# Patient Record
Sex: Male | Born: 1955 | Race: White | Hispanic: No | Marital: Married | Smoking: Current every day smoker
Health system: Southern US, Community
[De-identification: ages and names within clinical notes are randomized; demographics above are authoritative.]

## PROBLEM LIST (undated history)

## (undated) DIAGNOSIS — S0590XA Unspecified injury of unspecified eye and orbit, initial encounter: Secondary | ICD-10-CM

## (undated) DIAGNOSIS — B029 Zoster without complications: Secondary | ICD-10-CM

## (undated) HISTORY — PX: TONSILLECTOMY: SUR1361

---

## 2011-11-23 ENCOUNTER — Encounter (HOSPITAL_BASED_OUTPATIENT_CLINIC_OR_DEPARTMENT_OTHER): Payer: Self-pay | Admitting: Respiratory Therapy

## 2011-11-23 ENCOUNTER — Emergency Department (HOSPITAL_BASED_OUTPATIENT_CLINIC_OR_DEPARTMENT_OTHER)
Admission: EM | Admit: 2011-11-23 | Discharge: 2011-11-23 | Disposition: A | Discharge status: Home / Self Care | Payer: Worker's Compensation | Attending: Emergency Medicine | Admitting: Emergency Medicine

## 2011-11-23 DIAGNOSIS — F17200 Nicotine dependence, unspecified, uncomplicated: Secondary | ICD-10-CM | POA: Insufficient documentation

## 2011-11-23 DIAGNOSIS — J45909 Unspecified asthma, uncomplicated: Secondary | ICD-10-CM | POA: Insufficient documentation

## 2011-11-23 DIAGNOSIS — L089 Local infection of the skin and subcutaneous tissue, unspecified: Secondary | ICD-10-CM

## 2011-11-23 DIAGNOSIS — IMO0002 Reserved for concepts with insufficient information to code with codable children: Secondary | ICD-10-CM | POA: Insufficient documentation

## 2011-11-23 DIAGNOSIS — Y92009 Unspecified place in unspecified non-institutional (private) residence as the place of occurrence of the external cause: Secondary | ICD-10-CM | POA: Insufficient documentation

## 2011-11-23 HISTORY — DX: Unspecified injury of unspecified eye and orbit, initial encounter: S05.90XA

## 2011-11-23 MED ORDER — SULFAMETHOXAZOLE-TRIMETHOPRIM 800-160 MG PO TABS: 1.0000 | ORAL_TABLET | Freq: Two times a day (BID) | ORAL | Status: AC

## 2011-11-23 NOTE — ED Notes (Signed)
Pt touched his left upper arm with a hand sander on Thursday. Abrasion/burn to area. Also needs drug test.

## 2011-11-23 NOTE — Discharge Instructions (Signed)
Abrasions Abrasions are skin scrapes. Their treatment depends on how large and deep the abrasion is. Abrasions do not extend through all layers of the skin. A cut or lesion through all skin layers is called a laceration. HOME CARE INSTRUCTIONS   If you were given a dressing, change it at least once a day or as instructed by your caregiver. If the bandage sticks, soak it off with a solution of water or hydrogen peroxide.   Twice a day, wash the area with soap and water to remove all the cream/ointment. You may do this in a sink, under a tub faucet, or in a shower. Rinse off the soap and pat dry with a clean towel. Look for signs of infection (see below).   Reapply cream/ointment according to your caregiver's instruction. This will help prevent infection and keep the bandage from sticking. Telfa or gauze over the wound and under the dressing or wrap will also help keep the bandage from sticking.   If the bandage becomes wet, dirty, or develops a foul smell, change it as soon as possible.   Only take over-the-counter or prescription medicines for pain, discomfort, or fever as directed by your caregiver.  SEEK IMMEDIATE MEDICAL CARE IF:   Increasing pain in the wound.   Signs of infection develop: redness, swelling, surrounding area is tender to touch, or pus coming from the wound.   You have a fever.   Any foul smell coming from the wound or dressing.  Most skin wounds heal within ten days. Facial wounds heal faster. However, an infection may occur despite proper treatment. You should have the wound checked for signs of infection within 24 to 48 hours or sooner if problems arise. If you were not given a wound-check appointment, look closely at the wound yourself on the second day for early signs of infection listed above. MAKE SURE YOU:   Understand these instructions.   Will watch your condition.   Will get help right away if you are not doing well or get worse.  Document Released:  06/05/2005 Document Revised: 08/15/2011 Document Reviewed: 07/30/2011 [facility] Patient Information 2012 [place], [place].Abrasions Abrasions are skin scrapes. Their treatment depends on how large and deep the abrasion is. Abrasions do not extend through all layers of the skin. A cut or lesion through all skin layers is called a laceration. HOME CARE INSTRUCTIONS   If you were given a dressing, change it at least once a day or as instructed by your caregiver. If the bandage sticks, soak it off with a solution of water or hydrogen peroxide.   Twice a day, wash the area with soap and water to remove all the cream/ointment. You may do this in a sink, under a tub faucet, or in a shower. Rinse off the soap and pat dry with a clean towel. Look for signs of infection (see below).   Reapply cream/ointment according to your caregiver's instruction. This will help prevent infection and keep the bandage from sticking. Telfa or gauze over the wound and under the dressing or wrap will also help keep the bandage from sticking.   If the bandage becomes wet, dirty, or develops a foul smell, change it as soon as possible.   Only take over-the-counter or prescription medicines for pain, discomfort, or fever as directed by your caregiver.  SEEK IMMEDIATE MEDICAL CARE IF:   Increasing pain in the wound.   Signs of infection develop: redness, swelling, surrounding area is tender to touch, or pus coming from  the wound.   You have a fever.   Any foul smell coming from the wound or dressing.  Most skin wounds heal within ten days. Facial wounds heal faster. However, an infection may occur despite proper treatment. You should have the wound checked for signs of infection within 24 to 48 hours or sooner if problems arise. If you were not given a wound-check appointment, look closely at the wound yourself on the second day for early signs of infection listed above. MAKE SURE YOU:   Understand these instructions.    Will watch your condition.   Will get help right away if you are not doing well or get worse.  Document Released: 06/05/2005 Document Revised: 08/15/2011 Document Reviewed: 07/30/2011 [facility] Patient Information 2012 [place], [place].

## 2011-11-23 NOTE — ED Provider Notes (Signed)
History     CSN: [id number]  Arrival date & time 11/23/11  1320   None     Chief Complaint  Patient presents with  . Burn    (Consider location/radiation/quality/duration/timing/severity/associated sxs/prior treatment) Patient is a 56 y.o. male presenting with arm injury. The history is provided by the patient. No language interpreter was used.  Arm Injury  The incident occurred more than 2 days ago. The incident occurred at home. The injury mechanism is unknown. No protective equipment was used. There is an injury to the left upper arm. The pain is moderate. It is unlikely that a foreign body is present. There have been no prior injuries to these areas. His tetanus status is UTD.  Pt reports he scraped his arm with a sander.  Pt was sent here by employer for evaluation and drug screen   Past Medical History  Diagnosis Date  . Asthma   . Eye injury     History reviewed. No pertinent past surgical history.  History reviewed. No pertinent family history.  History  Substance Use Topics  . Smoking status: Current Everyday Smoker  . Smokeless tobacco: Not on file  . Alcohol Use: No      Review of Systems  Skin: Positive for wound.  All other systems reviewed and are negative.    Allergies  Review of patient's allergies indicates not on file.  Home Medications  No current outpatient prescriptions on file.  BP 124/74  Pulse 88  Temp(Src) 98.6 F (37 C) (Oral)  Resp 20  Ht 6\' 1"  (1.854 m)  Wt 225 lb (102.059 kg)  BMI 29.69 kg/m2  SpO2 99%  Physical Exam  Nursing note and vitals reviewed. Constitutional: He is oriented to person, place, and time. He appears well-developed and well-nourished.  HENT:  Head: Normocephalic and atraumatic.  Musculoskeletal: He exhibits tenderness.  Neurological: He is alert and oriented to person, place, and time.  Skin: There is erythema.  Psychiatric: He has a normal mood and affect.    ED Course  Procedures (including  critical care time)  Labs Reviewed - No data to display No results found.   No diagnosis found.    MDM  Pt given rx for bactrim.  I advised topical antibiotics       Lonia Skinner [place], [place] 11/23/11 1510

## 2011-11-23 NOTE — ED Notes (Signed)
Patients triage done by Thomasene Ripple, RN Not EMT

## 2011-11-23 NOTE — ED Provider Notes (Signed)
Medical screening examination/treatment/procedure(s) were performed by non-physician practitioner and as supervising physician I was immediately available for consultation/collaboration.   Akshith Moncus A. Bular Hickok, MD 11/23/11 2313 

## 2011-11-23 NOTE — ED Notes (Signed)
Urine drug screen obtained for workers comp.

## 2012-02-28 ENCOUNTER — Emergency Department (HOSPITAL_BASED_OUTPATIENT_CLINIC_OR_DEPARTMENT_OTHER): Payer: Worker's Compensation

## 2012-02-28 ENCOUNTER — Encounter (HOSPITAL_BASED_OUTPATIENT_CLINIC_OR_DEPARTMENT_OTHER): Payer: Self-pay | Admitting: *Deleted

## 2012-02-28 ENCOUNTER — Emergency Department (HOSPITAL_BASED_OUTPATIENT_CLINIC_OR_DEPARTMENT_OTHER)
Admission: EM | Admit: 2012-02-28 | Discharge: 2012-02-28 | Disposition: A | Discharge status: Home / Self Care | Payer: Worker's Compensation | Attending: Emergency Medicine | Admitting: Emergency Medicine

## 2012-02-28 DIAGNOSIS — S92911A Unspecified fracture of right toe(s), initial encounter for closed fracture: Secondary | ICD-10-CM

## 2012-02-28 DIAGNOSIS — Y9289 Other specified places as the place of occurrence of the external cause: Secondary | ICD-10-CM | POA: Insufficient documentation

## 2012-02-28 DIAGNOSIS — S92919A Unspecified fracture of unspecified toe(s), initial encounter for closed fracture: Secondary | ICD-10-CM | POA: Insufficient documentation

## 2012-02-28 DIAGNOSIS — F17200 Nicotine dependence, unspecified, uncomplicated: Secondary | ICD-10-CM | POA: Insufficient documentation

## 2012-02-28 DIAGNOSIS — W208XXA Other cause of strike by thrown, projected or falling object, initial encounter: Secondary | ICD-10-CM | POA: Insufficient documentation

## 2012-02-28 DIAGNOSIS — Y990 Civilian activity done for income or pay: Secondary | ICD-10-CM | POA: Insufficient documentation

## 2012-02-28 NOTE — Discharge Instructions (Signed)
Toe Fracture  Your caregiver has diagnosed you as having a fractured toe. A toe fracture is a break in the bone of a toe. "Buddy taping" is a way of splinting your broken toe, by taping the broken toe to the toe next to it. This "buddy taping" will keep the injured toe from moving beyond normal range of motion. Buddy taping also helps the toe heal in a more normal alignment. It may take 6 to 8 weeks for the toe injury to heal.  HOME CARE INSTRUCTIONS   · Leave your toes taped together for as long as directed by your caregiver or until you see a doctor for a follow-up examination. You can change the tape after bathing. Always use a small piece of gauze or cotton between the toes when taping them together. This will help the skin stay dry and prevent infection.  · Apply ice to the injury for 15 to 20 minutes each hour while awake for the first 2 days. Put the ice in a plastic bag and place a towel between the bag of ice and your skin.  · After the first 2 days, apply heat to the injured area. Use heat for the next 2 to 3 days. Place a heating pad on the foot or soak the foot in warm water as directed by your caregiver.  · Keep your foot elevated as much as possible to lessen swelling.  · Wear sturdy, supportive shoes. The shoes should not pinch the toes or fit tightly against the toes.  · Your caregiver may prescribe a rigid shoe if your foot is very swollen.  · Your may be given crutches if the pain is too great and it hurts too much to walk.  · Only take over-the-counter or prescription medicines for pain, discomfort, or fever as directed by your caregiver.  · If your caregiver has given you a follow-up appointment, it is very important to keep that appointment. Not keeping the appointment could result in a chronic or permanent injury, pain, and disability. If there is any problem keeping the appointment, you must call back to this facility for assistance.  SEEK MEDICAL CARE IF:   · You have increased pain or  swelling, not relieved with medications.  · The pain does not get better after 1 week.  · Your injured toe is cold when the others are warm.  SEEK IMMEDIATE MEDICAL CARE IF:   · The toe becomes cold, numb, or white.  · The toe becomes hot (inflamed) and red.  Document Released: 08/23/2000 Document Revised: 08/15/2011 Document Reviewed: 04/11/2008  [employer]® Patient Information ©2012 [employer].

## 2012-02-28 NOTE — ED Notes (Signed)
I also gave patient a post op shoe for comfort and protection.

## 2012-02-28 NOTE — ED Notes (Signed)
Right foot injury. States a board slid off a rip saw and landed on his right foot. 3rd toe is bruised and painful. Workmans comp they require a drug screen.

## 2012-02-28 NOTE — ED Provider Notes (Signed)
History     CSN: [id number]  Arrival date & time 02/28/12  2008   First MD Initiated Contact with Patient 02/28/12 2214      Chief Complaint  Patient presents with  . Foot Injury    (Consider location/radiation/quality/duration/timing/severity/associated sxs/prior treatment) HPI This 56 year old male accidentally had a board fall onto his right foot third toe just prior to arrival while at work. He has bruising pain and tenderness to the toe worse with movement and better if he keeps his toes still. He does not want pain medicines. He is no weakness or numbness. The skin is intact. He is no pain rest of his foot or toes. He has no other injury or concerns. There is no treatment prior to arrival. His pain is moderately severe worse with palpation, his pain is mild if he keeps his foot still and does not move his toe. Past Medical History  Diagnosis Date  . Asthma   . Eye injury     Past Surgical History  Procedure Date  . Tonsillectomy     No family history on file.  History  Substance Use Topics  . Smoking status: Current Everyday Smoker  . Smokeless tobacco: Not on file  . Alcohol Use: No      Review of Systems  Constitutional: Negative for fever.       10 Systems reviewed and are negative for acute change except as noted in the HPI.  HENT: Negative for congestion.   Eyes: Negative for discharge and redness.  Respiratory: Negative for cough and shortness of breath.   Cardiovascular: Negative for chest pain.  Gastrointestinal: Negative for vomiting and abdominal pain.  Musculoskeletal: Negative for back pain.  Skin: Negative for rash.  Neurological: Negative for syncope, numbness and headaches.  Psychiatric/Behavioral:       No behavior change.    Allergies  Review of patient's allergies indicates no known allergies.  Home Medications   Current Outpatient Rx  Name Route Sig Dispense Refill  . ACETAMINOPHEN 500 MG PO TABS Oral Take 1,000 mg by mouth every 6  (six) hours as needed. Patient used this medication for a headache.      BP 140/76  Pulse 88  Temp 97.8 F (36.6 C) (Oral)  Resp 16  SpO2 100%  Physical Exam  Nursing note and vitals reviewed. Constitutional:       Awake, alert, nontoxic appearance.  HENT:  Head: Atraumatic.  Eyes: Right eye exhibits no discharge. Left eye exhibits no discharge.  Neck: Neck supple.  Pulmonary/Chest: Effort normal. He exhibits no tenderness.  Abdominal: Soft. There is no tenderness. There is no rebound.  Musculoskeletal: He exhibits tenderness.       Baseline ROM, no obvious new focal weakness. Both arms and left leg are nontender. Right leg is nontender the thigh knee calf ankle and foot except for the third toe only. His right great toe as well as a second fourth and fifth toes are nontender. His right third toe is bruised and tender without rotational defect. He has normal light touch and capillary refill less than 2 seconds in his right third toe as well as his other toes and to the right foot. The right midfoot and hindfoot are nontender. His skin is intact to the injured toe and the rest of the foot.  Neurological:       Mental status and motor strength appears baseline for patient and situation.  Skin: No rash noted.  Psychiatric: He has a normal mood  and affect.    ED Course  Procedures (including critical care time)  Labs Reviewed - No data to display No results found.   1. Toe fracture, right       MDM  Pt stable in ED with no significant deterioration in condition.Patient / Family / Caregiver informed of clinical course, understand medical decision-making process, and agree with plan.        Hurman Horn, MD 03/05/12 [phone]

## 2013-09-30 IMAGING — CR DG TOE 3RD 2+V*R*
3 series · 3 of 3 positions shown · non-contrast
Comparison: None.

CLINICAL DATA: Foot injury, third toe pain.

RIGHT THIRD TOE

[t toes ap right]
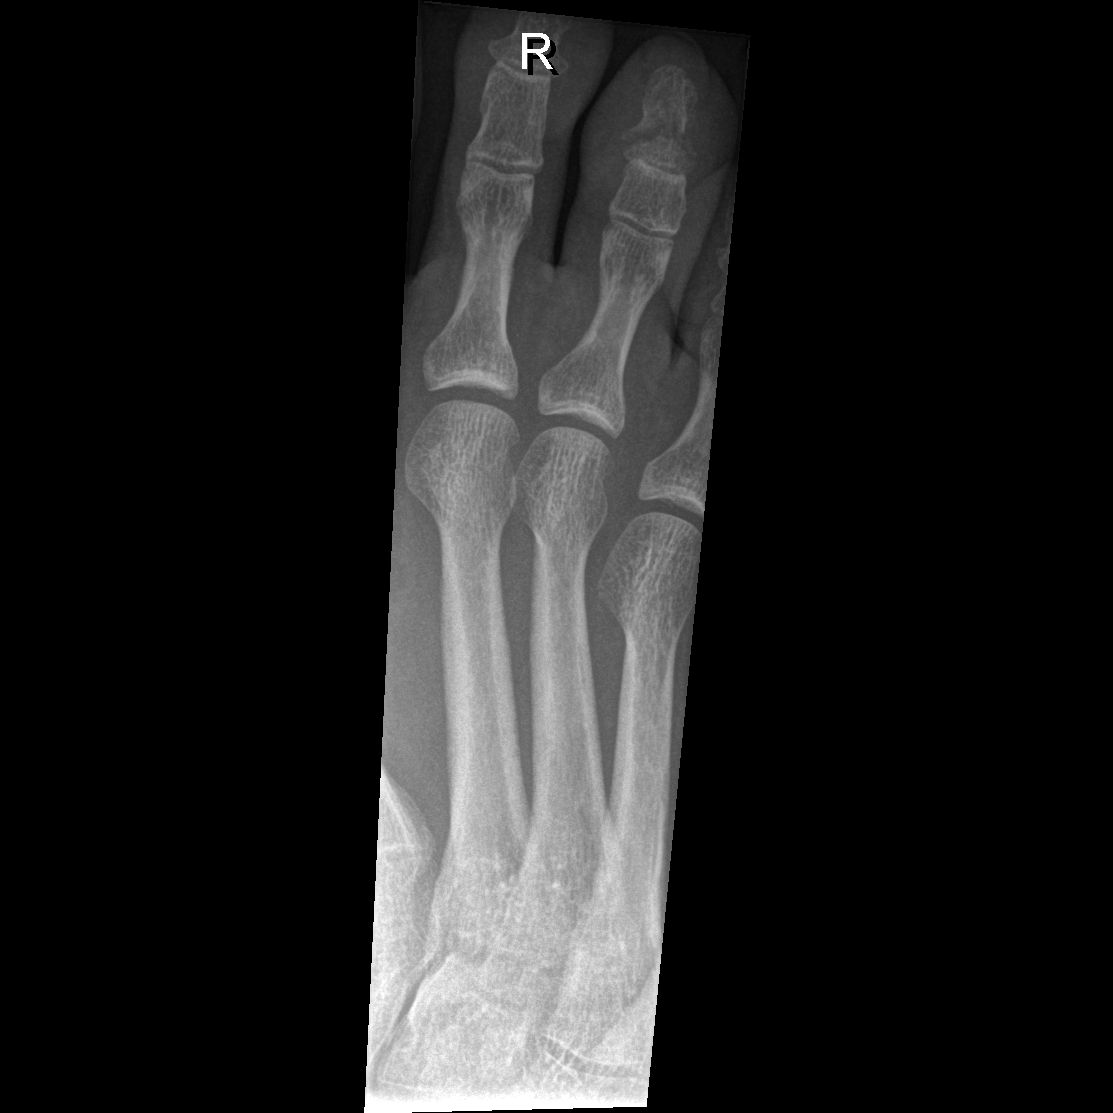

[t toes oblique right]
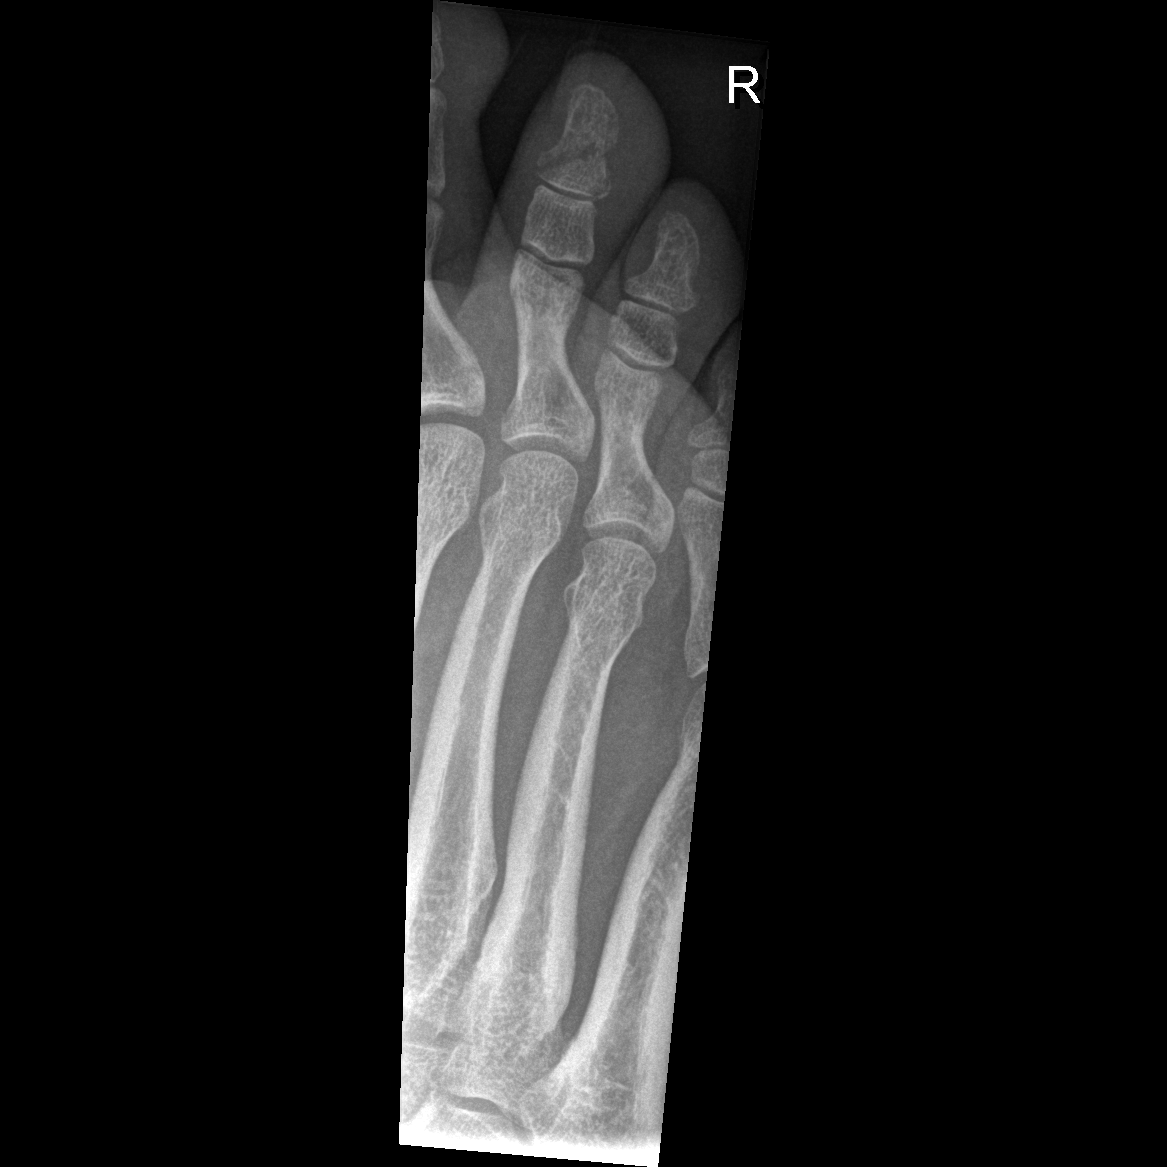

[t toes lateral right]
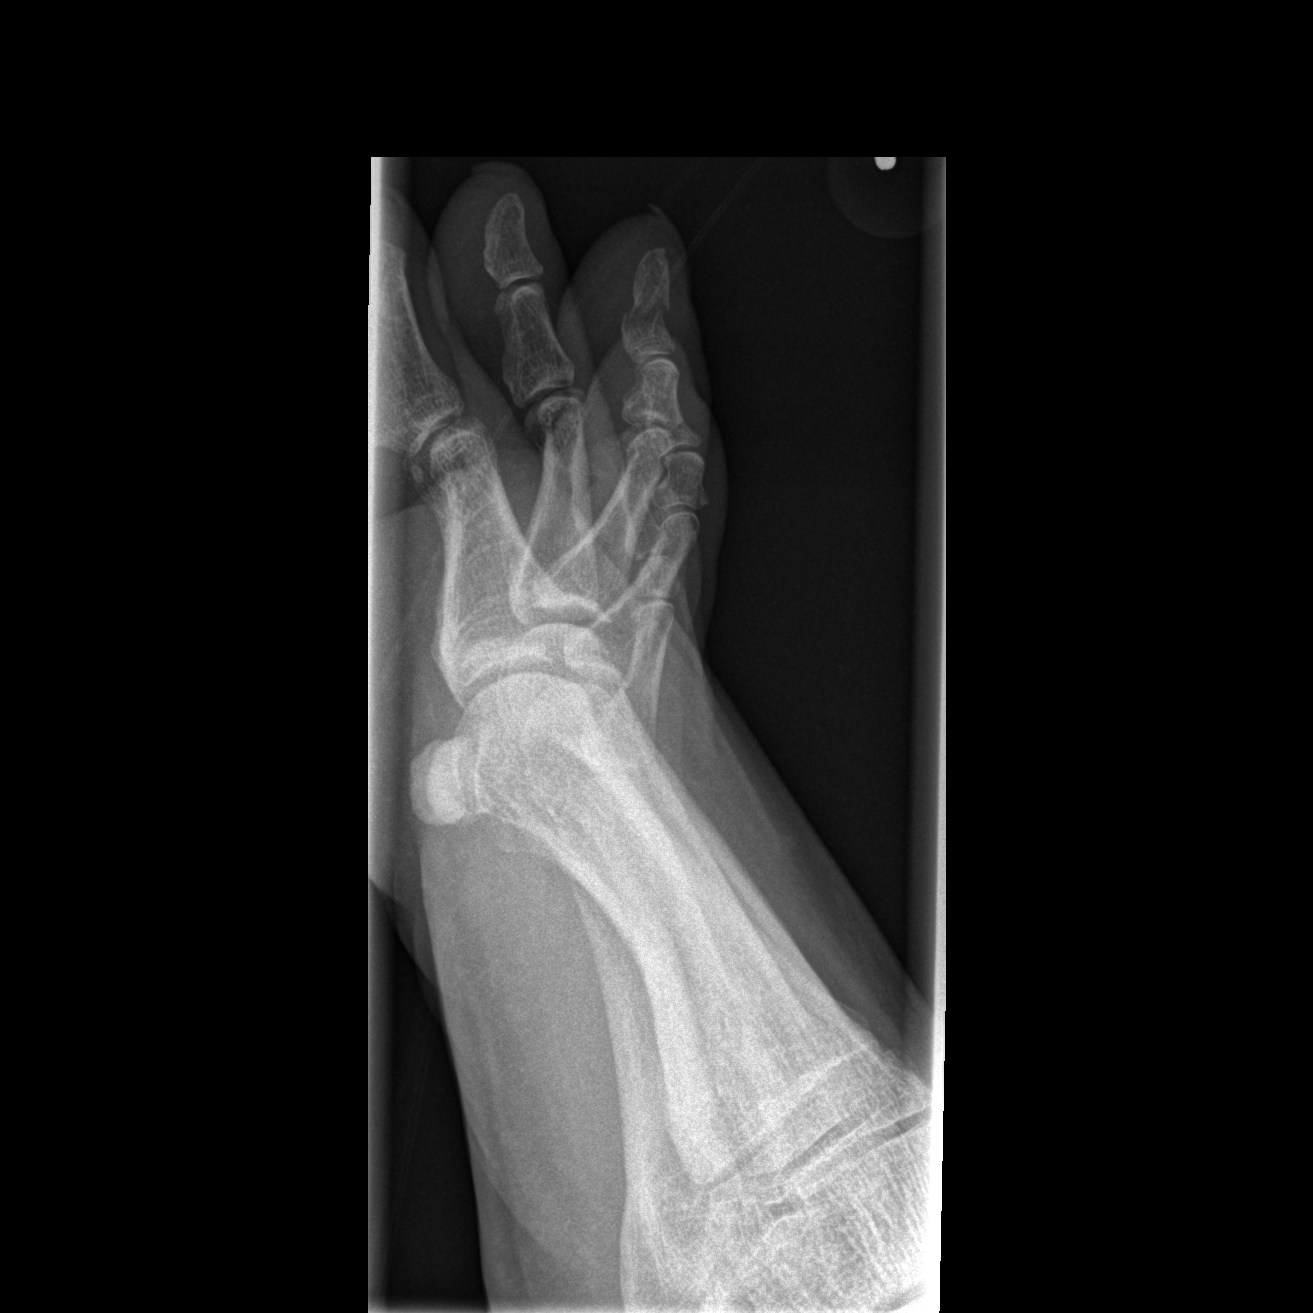

[3 of 3 positions shown; findings below may reference images not displayed]

FINDINGS: There is a fracture through the right third of the distal
phalanx.  Minimal displacement.  Fracture extends near the DIP
joint.  No additional acute bony abnormality.
IMPRESSION: Minimally-displaced fracture through the right third toe distal
phalanx.

## 2017-10-27 DIAGNOSIS — K611 Rectal abscess: Secondary | ICD-10-CM | POA: Insufficient documentation

## 2020-07-16 ENCOUNTER — Other Ambulatory Visit: Payer: Self-pay

## 2020-07-16 ENCOUNTER — Encounter: Payer: Self-pay | Admitting: Emergency Medicine

## 2020-07-16 ENCOUNTER — Emergency Department
Admission: EM | Admit: 2020-07-16 | Discharge: 2020-07-16 | Disposition: A | Discharge status: Home / Self Care | Payer: Medicare Other | Source: Home / Self Care | Attending: Family Medicine | Admitting: Family Medicine

## 2020-07-16 DIAGNOSIS — B029 Zoster without complications: Secondary | ICD-10-CM

## 2020-07-16 HISTORY — DX: Zoster without complications: B02.9

## 2020-07-16 MED ORDER — PREDNISONE 20 MG PO TABS: ORAL_TABLET | ORAL | 0 refills | Status: AC

## 2020-07-16 MED ORDER — DOXYCYCLINE HYCLATE 100 MG PO CAPS: ORAL_CAPSULE | ORAL | 0 refills | Status: AC

## 2020-07-16 NOTE — Discharge Instructions (Addendum)
Finish Valtrex.  May apply 1% hydrocortisone cream two or three times daily as needed.  May take Tylenol as needed for pain. Recommend that you have your vitamin D level checked.

## 2020-07-16 NOTE — ED Provider Notes (Signed)
`````````````````````````````` [name]    CSN: [id number] Arrival date & time: 07/16/20  0914      History   Chief Complaint Chief Complaint  Patient presents with  . Herpes Zoster    HPI Lee Williams is a 64 y.o. male.   Patient complains of onset of pain in his right upper back six days ago.  Two days ago he developed a painful rash on his right anterior chest.  He was seen by his PCP at that time and started on Valtrex for herpes zoster.  He has now developed rash on his right upper back and in right axilla, and is concerned that the rash will continue to spread. Patient has a past history of recurring abscesses.  The history is provided by the patient.  Rash Location: right upper back, axilla, and right upper chest. Quality: burning, dryness, itchiness, painful and redness   Quality: not blistering, not bruising, not draining, not peeling, not scaling, not swelling and not weeping   Pain details:    Quality:  Aching and burning   Severity:  Moderate   Duration:  3 days   Timing:  Constant   Progression:  Worsening Severity:  Moderate Onset quality:  Sudden Duration:  3 days Timing:  Constant Progression:  Worsening Chronicity:  New Context comment:  Herpes zoster Relieved by:  Nothing Worsened by:  Nothing Associated symptoms: no abdominal pain, no diarrhea, no fatigue, no fever, no headaches, no induration, no joint pain, no myalgias and no nausea     Past Medical History:  Diagnosis Date  . Asthma   . Eye injury   . Shingles     Patient Active Problem List   Diagnosis Date Noted  . Perirectal abscess 10/27/2017    Past Surgical History:  Procedure Laterality Date  . TONSILLECTOMY         Home Medications    Prior to Admission medications   Medication Sig Start Date End Date Taking? Authorizing Provider  valACYclovir (VALTREX) 1000 MG tablet Take 1,000 mg by mouth 3 (three) times daily. 07/14/20  Yes [provider]    acetaminophen (TYLENOL) 500 MG tablet Take 1,000 mg by mouth every 6 (six) hours as needed. Patient used this medication for a headache.    [provider]  doxycycline (VIBRAMYCIN) 100 MG capsule Take one cap PO Q12hr with food. 07/16/20   Lattie Haw, MD  predniSONE (DELTASONE) 20 MG tablet Take one tab by mouth twice daily for 4 days, then one daily for 3 days. Take with food. 07/16/20   Lattie Haw, MD    Family History Family History  Adopted: Yes  Family history unknown: Yes    Social History Social History   Tobacco Use  . Smoking status: Current Every Day Smoker    Packs/day: 1.00  . Smokeless tobacco: Never Used  Vaping Use  . Vaping Use: Never used  Substance Use Topics  . Alcohol use: No  . Drug use: No     Allergies   Patient has no known allergies.   Review of Systems Review of Systems  Constitutional: Negative for activity change, appetite change, chills, diaphoresis, fatigue and fever.  Gastrointestinal: Negative for abdominal pain, diarrhea and nausea.  Musculoskeletal: Negative for arthralgias and myalgias.  Skin: Positive for rash.  Neurological: Negative for headaches.  All other systems reviewed and are negative.    Physical Exam Triage Vital Signs ED Triage Vitals  Enc Vitals Group  BP 07/16/20 0949 (!) 153/84     Pulse Rate 07/16/20 0949 91     Resp 07/16/20 0949 17     Temp 07/16/20 0949 98.7 F (37.1 C)     Temp Source 07/16/20 0949 Oral     SpO2 07/16/20 0949 99 %     Weight 07/16/20 0950 244 lb (110.7 kg)     Height 07/16/20 0950 6\' 1"  (1.854 m)     Head Circumference --      Peak Flow --      Pain Score 07/16/20 0948 3     Pain Loc --      Pain Edu? --      Excl. in GC? --    No data found.  Updated Vital Signs BP (!) 153/84 (BP Location: Left Arm)   Pulse 91   Temp 98.7 F (37.1 C) (Oral)   Resp 17   Ht 6\' 1"  (1.854 m)   Wt 110.7 kg   SpO2 99%   BMI 32.19 kg/m   Visual Acuity Right Eye  Distance:   Left Eye Distance:   Bilateral Distance:    Right Eye Near:   Left Eye Near:    Bilateral Near:     Physical Exam Vitals and nursing note reviewed.  Constitutional:      General: He is not in acute distress. HENT:     Head: Normocephalic.     Right Ear: External ear normal.     Left Ear: External ear normal.     Nose: Nose normal.     Mouth/Throat:     Pharynx: Oropharynx is clear.  Eyes:     Conjunctiva/sclera: Conjunctivae normal.     Pupils: Pupils are equal, round, and reactive to light.  Cardiovascular:     Rate and Rhythm: Normal rate.  Pulmonary:     Effort: Pulmonary effort is normal.       Comments: Right upper posterior chest has an erythematous herpetiform eruption as noted on diagram.  Chest:       Comments: Right anterior chest and axilla have erythematous herpetiform lesions with surrounding erythema as noted on diagram.  Musculoskeletal:     Cervical back: Neck supple.     Right lower leg: No edema.     Left lower leg: Edema present.  Lymphadenopathy:     Cervical: No cervical adenopathy.  Skin:    General: Skin is warm and dry.  Neurological:     Mental Status: He is alert and oriented to person, place, and time.      UC Treatments / Results  Labs (all labs ordered are listed, but only abnormal results are displayed) Labs Reviewed - No data to display  EKG   Radiology No results found.  Procedures Procedures (including critical care time)  Medications Ordered in UC Medications - No data to display  Initial Impression / Assessment and Plan / UC Course  I have reviewed the triage vital signs and the nursing notes.  Pertinent labs & imaging results that were available during my care of the patient were reviewed by me and considered in my medical decision making (see chart for details).    Concern for early developing secondary bacterial cellulitis (note past history of recurring abscesses). Begin prednisone burst/taper,  and empiric doxycycline for staph coverage. Followup with Family Doctor if not improved in about 8 days.    Final Clinical Impressions(s) / UC Diagnoses   Final diagnoses:  Herpes zoster without complication  Discharge Instructions     Finish Valtrex.  May apply 1% hydrocortisone cream two or three times daily as needed.  May take Tylenol as needed for pain. Recommend that you have your vitamin D level checked.    ED Prescriptions    Medication Sig Dispense Auth. Provider   doxycycline (VIBRAMYCIN) 100 MG capsule Take one cap PO Q12hr with food. 14 capsule Lattie Haw, MD   predniSONE (DELTASONE) 20 MG tablet Take one tab by mouth twice daily for 4 days, then one daily for 3 days. Take with food. 11 tablet Lattie Haw, MD        Lattie Haw, MD 07/23/20 1040

## 2020-07-16 NOTE — ED Triage Notes (Addendum)
Pt was seen at [facility] on Friday for Shingles  Started on valacyclovir Initial pain started on Tuesday right side of back & chest Rash has now spread to under arm, multiple blisters noted Pt here today because [facility] closed Pt asking about topical cream, concerned about the spread  Tylenol at 0500 No COVID Vaccine
